# Patient Record
Sex: Male | Born: 1975 | Hispanic: No | Marital: Married | Smoking: Former smoker
Health system: Southern US, Community
[De-identification: ages and names within clinical notes are randomized; demographics above are authoritative.]

## PROBLEM LIST (undated history)

## (undated) DIAGNOSIS — F329 Major depressive disorder, single episode, unspecified: Secondary | ICD-10-CM

## (undated) DIAGNOSIS — F419 Anxiety disorder, unspecified: Secondary | ICD-10-CM

## (undated) DIAGNOSIS — F32A Depression, unspecified: Secondary | ICD-10-CM

## (undated) HISTORY — DX: Major depressive disorder, single episode, unspecified: F32.9

## (undated) HISTORY — DX: Anxiety disorder, unspecified: F41.9

## (undated) HISTORY — DX: Depression, unspecified: F32.A

---

## 2010-03-28 ENCOUNTER — Ambulatory Visit: Payer: Self-pay | Admitting: Family Medicine

## 2010-05-23 ENCOUNTER — Ambulatory Visit: Payer: Self-pay | Admitting: Family Medicine

## 2010-05-23 DIAGNOSIS — F411 Generalized anxiety disorder: Secondary | ICD-10-CM | POA: Insufficient documentation

## 2010-05-23 DIAGNOSIS — F329 Major depressive disorder, single episode, unspecified: Secondary | ICD-10-CM

## 2010-10-31 ENCOUNTER — Telehealth (INDEPENDENT_AMBULATORY_CARE_PROVIDER_SITE_OTHER): Payer: Self-pay | Admitting: *Deleted

## 2010-11-03 ENCOUNTER — Ambulatory Visit: Payer: Self-pay | Admitting: Family Medicine

## 2010-11-05 DIAGNOSIS — E669 Obesity, unspecified: Secondary | ICD-10-CM

## 2011-01-16 NOTE — Assessment & Plan Note (Signed)
Summary: sinus- room 1   Vital Signs:  Patient profile:   35 year old male Height:      73 inches Weight:      232.25 pounds BMI:     30.75 O2 Sat:      98 % on Room air Pulse rate:   74 / minute Resp:     16 per minute BP sitting:   120 / 80  (left arm)  Vitals Entered By: Adella Hare LPN (May 23, 6386 8:21 AM) CC: sinus drainage, hoarse Is Patient Diabetic? No Pain Assessment Patient in pain? no      Comments did not bring meds to office   CC:  sinus drainage and hoarse.  History of Present Illness: Pt is here today with c/o hoarse voice and post nasal drainage.  He denies nasal congestion or sinus pressure.  states he has phlegm in his throat which he tries to cough up.  His throat was sore over last wkend but isnt sore now.  No chest congestion or cough.  No fever or chills.  Pt is also taking Zoloft for  anxiety.  He has noticed some improvement but not as much as he had hoped.  He has less of the mind racing when he awakens, and is not as easily irritated by the children.  Sleep is good, but this has not been a problem.  No HI or SI.    Current Medications (verified): 1)  Sertraline Hcl 50 Mg Tabs (Sertraline Hcl) .... Take 1/2 Tab Daily X 6 Days, Then Take 1 Daily  Allergies (verified): No Known Drug Allergies  Past History:  Past medical history reviewed for relevance to current acute and chronic problems.  Past Medical History: Anxiety Depression  Review of Systems General:  Denies chills and fever. ENT:  Complains of postnasal drainage; denies earache, nasal congestion, sinus pressure, and sore throat. CV:  Denies chest pain or discomfort and palpitations. Resp:  Denies cough and shortness of breath. Psych:  Complains of anxiety and depression; denies easily angered, easily tearful, irritability, panic attacks, suicidal thoughts/plans, and thoughts /plans of harming others.  Physical Exam  General:  Well-developed,well-nourished,in no acute  distress; alert,appropriate and cooperative throughout examination Head:  Normocephalic and atraumatic without obvious abnormalities. No apparent alopecia or balding. Ears:  External ear exam shows no significant lesions or deformities.  Otoscopic examination reveals clear canals, tympanic membranes are intact bilaterally without bulging, retraction, inflammation or discharge. Hearing is grossly normal bilaterally. Nose:  no external deformity, no nasal discharge, no mucosal edema, and mucosal erythema.   Mouth:  Oral mucosa and oropharynx without lesions or exudates.  Teeth in good repair. Neck:  No deformities, masses, or tenderness noted. Lungs:  Normal respiratory effort, chest expands symmetrically. Lungs are clear to auscultation, no crackles or wheezes. Heart:  Normal rate and regular rhythm. S1 and S2 normal without gallop, murmur, click, rub or other extra sounds. Cervical Nodes:  No lymphadenopathy noted Psych:  Cognition and judgment appear intact. Alert and cooperative with normal attention span and concentration. No apparent delusions, illusions, hallucinations   Impression & Recommendations:  Problem # 1:  URI (ICD-465.9) Assessment New Discussed with pt that this is probably viral.  Abx rx  sent to pharmacy, but asked pt to wailt a couple days to fill since his syptoms are improving.  Problem # 2:  DEPRESSION/ANXIETY (ICD-300.4) Assessment: Improved Increase Zoloft dose.  Problem # 3:  ELEVATED BLOOD PRESSURE WITHOUT DIAGNOSIS OF HYPERTENSION (ICD-796.2) Assessment:  Improved  BP today: 120/80 Prior BP: 120/90 (03/28/2010)  Instructed in low sodium diet (DASH Handout) and behavior modification.    Complete Medication List: 1)  Amoxicillin 875 Mg Tabs (Amoxicillin) .... Take 1 bid 2)  Sertraline Hcl 100 Mg Tabs (Sertraline hcl) .... Take 1 daily  Patient Instructions: 1)  Please schedule a follow-up appointment in 1 month. 2)  I have increased your zoloft dose to 100  mg a day.  You may take 2 a day of the 50 mg tablets you have at home to use them up. 3)  I recommend you use Mucinex for your congestion.  Increase fluids and rest your voice. 4)  I have sent a prescription for an antibiotic to the pharmacy.  I would recommend you wait a couple of days before starting this to see if your syptoms improve. Prescriptions: SERTRALINE HCL 100 MG TABS (SERTRALINE HCL) take 1 daily  #30 x 3   Entered and Authorized by:   Esperanza Sheets PA   Signed by:   Esperanza Sheets PA on 05/23/2010   Method used:   Electronically to        [facility] [phone]* (retail)       [address] [place], [place]  [address]       Ph: [phone]       Fax: [phone]   RxID:   [id number] AMOXICILLIN 875 MG TABS (AMOXICILLIN) take 1 bid  #20 x 0   Entered and Authorized by:   Esperanza Sheets PA   Signed by:   Esperanza Sheets PA on 05/23/2010   Method used:   Electronically to        [facility]. [address] [phone]* (retail)       [address], [place]  [address]       Ph: [phone]       Fax: [phone]   RxID:   [id number]

## 2011-01-16 NOTE — Assessment & Plan Note (Signed)
Summary: new patient- room 1   Vital Signs:  Patient profile:   35 year old male Height:      73 inches Weight:      229.50 pounds BMI:     30.39 O2 Sat:      97 % on Room air Pulse rate:   85 / minute Resp:     16 per minute BP sitting:   120 / 90  (left arm)  Vitals Entered By: Adella Hare LPN (March 28, 2010 8:55 AM)  Nutrition Counseling: Patient's BMI is greater than 25 and therefore counseled on weight management options.  Serial Vital Signs/Assessments:  Time      Position  BP       Pulse  Resp  Temp     By                     124/90                         Esperanza Sheets PA  CC: new patient Is Patient Diabetic? No Pain Assessment Patient in pain? no        CC:  new patient.  History of Present Illness: New pt here to establish care with new PCP. Has been yrs since he has had a Dr/PCP.  C/o feelings of depression & anxiety x 1 -1 1/2 yrs.  Doesn't enjoy doing things like he use to, gets easily irritated or angered, feels sad.  States he has a very good life & no reason to feel like this.  He is afraid he is going to push his family away.  Has anxiety too, with heart racing & feeling nervous/worrying.  He sleeps 5-6 hrs/ night.  Feels rested when awakens but feels better when he can sleep about 8 hrs.  His mind starts racing a few minutes after awakens. No SI or HI.  No situational factors that seemed to precipitate this.  + FH of depression.  Pt states BP at home when checks it runs 140's over 90-94. No headaches, or chest pain.  He has been working on [employer], read about what he should & shouldn't be eating online, has been exercising & working on wt loss.    Current Medications (verified): 1)  None  Allergies (verified): No Known Drug Allergies  Past History:  Past medical, surgical, family and social histories (including risk factors) reviewed for relevance to current acute and chronic problems.  Family History: Reviewed history and no changes  required. Mother living- HTN, Gout Father living- DM, Depression, HTN One sister deceased- mva  Social History: Reviewed history and no changes required. Unemployed Married one week One child Never Smoked Alcohol use-yes socially Drug use-no Regular exercise-yes Smoking Status:  never Drug Use:  no Does Patient Exercise:  yes  Review of Systems CV:  Complains of palpitations; denies chest pain or discomfort; PALPITATIONS WITH ANXIETY. Resp:  Denies cough and shortness of breath. GI:  Denies abdominal pain, change in bowel habits, and indigestion. GU:  Denies decreased libido and erectile dysfunction.  Physical Exam  General:  Well-developed,well-nourished,in no acute distress; alert,appropriate and cooperative throughout examination Head:  Normocephalic and atraumatic without obvious abnormalities. No apparent alopecia or balding. Ears:  External ear exam shows no significant lesions or deformities.  Otoscopic examination reveals clear canals, tympanic membranes are intact bilaterally without bulging, retraction, inflammation or discharge. Hearing is grossly normal bilaterally. Nose:  External nasal examination shows no  deformity or inflammation. Nasal mucosa are pink and moist without lesions or exudates. Mouth:  Oral mucosa and oropharynx without lesions or exudates.  Teeth in good repair. Neck:  No deformities, masses, or tenderness noted.no thyromegaly.   Lungs:  Normal respiratory effort, chest expands symmetrically. Lungs are clear to auscultation, no crackles or wheezes. Heart:  Normal rate and regular rhythm. S1 and S2 normal without gallop, murmur, click, rub or other extra sounds. Skin:  Intact without suspicious lesions or rashes Cervical Nodes:  No lymphadenopathy noted Psych:  Cognition and judgment appear intact. Alert and cooperative with normal attention span and concentration. No apparent delusions, illusions, hallucinationsgood eye contact, not anxious appearing,  not depressed appearing, and not agitated.     Impression & Recommendations:  Problem # 1:  DEPRESSION/ANXIETY (ICD-300.4) Assessment New  Orders: T-TSH ([id number])  Problem # 2:  ELEVATED BLOOD PRESSURE WITHOUT DIAGNOSIS OF HYPERTENSION (ICD-796.2) Assessment: Improved Discussed with pt that his diastolic # is mildly elevated.  Will need to monitor this. Recommend he bring his home BP monitor to his next visit to check accuracy. Continue to work on diet, exericise & wt loss.  BP today: 120/90  Instructed in low sodium diet (DASH diett) and behavior modification.    Complete Medication List: 1)  Sertraline Hcl 50 Mg Tabs (Sertraline hcl) .... Take 1/2 tab daily x 6 days, then take 1 daily  Other Orders: T-Comprehensive Metabolic Panel [phone]) T-Lipid Profile ([id number]) T-CBC No Diff ([id number]) T-Vitamin D (25-Hydroxy) ([id number])  Patient Instructions: 1)  Please schedule a follow-up appointment in 1 month. 2)  Continue exercising & working on weight loss. 3)  I have ordered blood work for you to have done fasting today. 4)  I have prescribed Zoloft to take for depression & anxiety.  Take as directed on the bottle. Prescriptions: SERTRALINE HCL 50 MG TABS (SERTRALINE HCL) take 1/2 tab daily x 6 days, then take 1 daily  #30 x 1   Entered and Authorized by:   Esperanza Sheets PA   Signed by:   Esperanza Sheets PA on 03/28/2010   Method used:   Electronically to        [facility]. [address] [phone]* (retail)       [address], [place]  [address]       Ph: [phone]       Fax: [phone]   RxID:   [id number]

## 2011-01-16 NOTE — Progress Notes (Signed)
Summary: ZOLOFT  Phone Note Call from Patient   Summary of Call: ZOLOFT 100 MG, CALLED INTO [facility] IN [place] [facility]. Initial call taken by: Curtis Sites,  October 31, 2010 8:19 AM  Follow-up for Phone Call        patient needs appt Follow-up by: Adella Hare LPN,  November 01, 2010 12:11 PM  Additional Follow-up for Phone Call Additional follow up Details #1::        LEFT MESSAGE Additional Follow-up by: Lind Guest,  November 01, 2010 1:34 PM    Additional Follow-up for Phone Call Additional follow up Details #2::    APPT 11.17.11  Follow-up by: Lind Guest,  November 01, 2010 3:12 PM

## 2011-01-16 NOTE — Assessment & Plan Note (Signed)
Summary: MEDS   Vital Signs:  Patient profile:   35 year old male Height:      73 inches Weight:      233.50 pounds BMI:     30.92 O2 Sat:      96 % on Room air Pulse rate:   80 / minute Pulse rhythm:   regular Resp:     16 per minute BP sitting:   120 / 80  (left arm)  Vitals Entered By: Adella Hare LPN (November 03, 2010 10:30 AM)  Nutrition Counseling: Patient's BMI is greater than 25 and therefore counseled on weight management options.  O2 Flow:  Room air CC: follow-up visit/ refill Is Patient Diabetic? No Pain Assessment Patient in pain? no        Primary Care [facility]l:  Syliva Overman MD  CC:  follow-up visit/ refill.  History of Present Illness: Reports  that he has ben doing much better since starting setraline.He is more involved with the family, and less irritable , withdrawn and stressed. he does admit to overindulgence in alcohol, and realizes the need to cut back on this. he is also not involved in any regular exercise and is obese, and has decided that this needs to be changed Denies recent fever or chills. Denies sinus pressure, nasal congestion , ear pain or sore throat. Denies chest congestion, or cough productive of sputum. Denies chest pain, palpitations, PND, orthopnea or leg swelling. Denies abdominal pain, nausea, vomitting, diarrhea or constipation. Denies change in bowel movements or bloody stool. Denies dysuria , frequency, incontinence or hesitancy. Denies  joint pain, swelling, or reduced mobility. Denies headaches, vertigo, seizures. Denies depression, anxiety or insomnia. Denies  rash, lesions, or itch.     Current Medications (verified): 1)  Sertraline Hcl 100 Mg Tabs (Sertraline Hcl) .... Take 1 Daily  Allergies (verified): No Known Drug Allergies  Family History: Mother living- HTN, Gout Father living- DM, Depression, HTN 3 sisters all iving 1 brother died in an mVA age 15  Social History: employed in  2009,transportation Married in 2011 One daughter  Never Smoked Alcohol use-yes,  Drug use-no Regular exercise-yes  Review of Systems      See HPI Eyes:  Denies blurring and discharge. Endo:  Denies cold intolerance, excessive hunger, excessive thirst, and excessive urination. Heme:  Denies abnormal bruising and bleeding. Allergy:  Denies hives or rash and itching eyes.  Physical Exam  General:  Well-developed,well-nourished,in no acute distress; alert,appropriate and cooperative throughout examination HEENT: No facial asymmetry,  EOMI, No sinus tenderness, TM's Clear, oropharynx  pink and moist.   Chest: Clear to auscultation bilaterally.  CVS: S1, S2, No murmurs, No S3.   Abd: Soft, Nontender.  MS: Adequate ROM spine, hips, shoulders and knees.  Ext: No edema.   CNS: CN 2-12 intact, power tone and sensation normal throughout.   Skin: Intact, no visible lesions or rashes.  Psych: Good eye contact, normal affect.  Memory intact, not anxious or depressed appearing.    Impression & Recommendations:  Problem # 1:  DEPRESSION (ICD-311) Assessment Improved  His updated medication list for this problem includes:    Sertraline Hcl 100 Mg Tabs (Sertraline hcl) .Marland Kitchen... Take 1 daily  Problem # 2:  ANXIETY (ICD-300.00) Assessment: Improved  His updated medication list for this problem includes:    Sertraline Hcl 100 Mg Tabs (Sertraline hcl) .Marland Kitchen... Take 1 daily  Problem # 3:  OBESITY (ICD-278.00) Assessment: Deteriorated  Ht: 73 (11/03/2010)   Wt: 233.50 (11/03/2010)  BMI: 30.92 (11/03/2010) therapeutic lifestyle change discussed and encouraged  Complete Medication List: 1)  Sertraline Hcl 100 Mg Tabs (Sertraline hcl) .... Take 1 daily  Other Orders: T-Basic Metabolic Panel [phone]) T-Lipid Profile [phone]) T-CBC w/Diff [phone]) T-TSH [phone]) Influenza Vaccine NON MCR (28413)  Patient Instructions: 1)  Please schedule a follow-up appointment in 4   months. 2)  It is important that you exercise regularly at least 40 minutes 5 times a week. If you develop chest pain, have severe difficulty breathing, or feel very tired , stop exercising immediately and seek medical attention. 3)  You need to lose weight. Consider a lower calorie diet and regular exercise. goal is 5 to 10 pounds 4)  I am happy that your meds are working well for you. 5)  BMP prior to visit, ICD-9: 6)  Lipid Panel prior to visit, ICD-9:  fasting  7)  TSH prior to visit, ICD-9: 8)  CBC w/ Diff prior to visit, ICD-9: 9)  pLS reduce alcohol use Prescriptions: SERTRALINE HCL 100 MG TABS (SERTRALINE HCL) take 1 daily  #30 x 3   Entered by:   Adella Hare LPN   Authorized by:   Syliva Overman MD   Signed by:   Adella Hare LPN on 24/40/1027   Method used:   Electronically to        [facility] [address] [phone]* (retail)       [address] [place], [place]  [address]       Ph: [phone]       Fax: [phone]   RxID:   [id number]    Orders Added: 1)  Est. Patient Level IV [23557] 2)  T-Basic Metabolic Panel [phone] 3)  T-Lipid Profile [[id number]] 4)  T-CBC w/Diff [[id number]] 5)  T-TSH [[id number]] 6)  Influenza Vaccine NON MCR [00028]   Immunizations Administered:  Influenza Vaccine # 1:    Vaccine Type: Fluvax Non-MCR    Site: left deltoid    Mfr: novartis    Dose: 0.5 ml    Route: IM    Given by: Adella Hare LPN    Exp. Date: 04/2011    Lot #: [id number]    VIS given: 07/11/10 version given November 03, 2010.   Immunizations Administered:  Influenza Vaccine # 1:    Vaccine Type: Fluvax Non-MCR    Site: left deltoid    Mfr: novartis    Dose: 0.5 ml    Route: IM    Given by: Adella Hare LPN    Exp. Date: 04/2011    Lot #: [id number]    VIS given: 07/11/10 version given November 03, 2010.

## 2011-03-05 ENCOUNTER — Encounter: Payer: Self-pay | Admitting: Family Medicine

## 2011-03-05 ENCOUNTER — Ambulatory Visit: Payer: Self-pay | Admitting: Family Medicine

## 2011-03-06 ENCOUNTER — Encounter: Payer: Self-pay | Admitting: Family Medicine

## 2011-04-22 ENCOUNTER — Emergency Department (HOSPITAL_COMMUNITY)
Admission: EM | Admit: 2011-04-22 | Discharge: 2011-04-22 | Disposition: A | Discharge status: Home / Self Care | Payer: Self-pay | Attending: Emergency Medicine | Admitting: Emergency Medicine

## 2011-04-22 DIAGNOSIS — R300 Dysuria: Secondary | ICD-10-CM | POA: Insufficient documentation

## 2011-04-22 DIAGNOSIS — N342 Other urethritis: Secondary | ICD-10-CM | POA: Insufficient documentation

## 2011-04-22 DIAGNOSIS — R319 Hematuria, unspecified: Secondary | ICD-10-CM | POA: Insufficient documentation

## 2011-04-22 DIAGNOSIS — N509 Disorder of male genital organs, unspecified: Secondary | ICD-10-CM | POA: Insufficient documentation

## 2011-04-22 DIAGNOSIS — R110 Nausea: Secondary | ICD-10-CM | POA: Insufficient documentation

## 2011-04-22 LAB — URINALYSIS, ROUTINE W REFLEX MICROSCOPIC
Glucose, UA: NEGATIVE mg/dL
Protein, ur: NEGATIVE mg/dL

## 2011-04-22 LAB — URINE MICROSCOPIC-ADD ON

## 2011-04-24 LAB — URINE CULTURE
Colony Count: NO GROWTH
Culture  Setup Time: 201205062230
Culture: NO GROWTH

## 2011-04-24 LAB — GC/CHLAMYDIA PROBE AMP, URINE: GC Probe Amp, Urine: NEGATIVE

## 2013-07-24 ENCOUNTER — Emergency Department (HOSPITAL_COMMUNITY): Payer: BC Managed Care – PPO

## 2013-07-24 ENCOUNTER — Encounter (HOSPITAL_COMMUNITY): Payer: Self-pay | Admitting: *Deleted

## 2013-07-24 ENCOUNTER — Emergency Department (HOSPITAL_COMMUNITY)
Admission: EM | Admit: 2013-07-24 | Discharge: 2013-07-24 | Disposition: A | Discharge status: Home / Self Care | Payer: BC Managed Care – PPO | Attending: Emergency Medicine | Admitting: Emergency Medicine

## 2013-07-24 DIAGNOSIS — S81009A Unspecified open wound, unspecified knee, initial encounter: Secondary | ICD-10-CM | POA: Insufficient documentation

## 2013-07-24 DIAGNOSIS — Z8659 Personal history of other mental and behavioral disorders: Secondary | ICD-10-CM | POA: Insufficient documentation

## 2013-07-24 DIAGNOSIS — S43086A Other dislocation of unspecified shoulder joint, initial encounter: Secondary | ICD-10-CM | POA: Insufficient documentation

## 2013-07-24 DIAGNOSIS — Y92410 Unspecified street and highway as the place of occurrence of the external cause: Secondary | ICD-10-CM | POA: Insufficient documentation

## 2013-07-24 DIAGNOSIS — Y9389 Activity, other specified: Secondary | ICD-10-CM | POA: Insufficient documentation

## 2013-07-24 DIAGNOSIS — S43004A Unspecified dislocation of right shoulder joint, initial encounter: Secondary | ICD-10-CM

## 2013-07-24 DIAGNOSIS — T148XXA Other injury of unspecified body region, initial encounter: Secondary | ICD-10-CM

## 2013-07-24 DIAGNOSIS — S91009A Unspecified open wound, unspecified ankle, initial encounter: Secondary | ICD-10-CM | POA: Insufficient documentation

## 2013-07-24 DIAGNOSIS — F17200 Nicotine dependence, unspecified, uncomplicated: Secondary | ICD-10-CM | POA: Insufficient documentation

## 2013-07-24 MED ORDER — HYDROCODONE-ACETAMINOPHEN 5-325 MG PO TABS: 1.0000 | ORAL_TABLET | ORAL | Status: DC | PRN

## 2013-07-24 MED ORDER — SODIUM CHLORIDE 0.9 % IV BOLUS (SEPSIS)
1000.0000 mL | Freq: Once | INTRAVENOUS | Status: AC
  Administered 2013-07-24: 1000 mL via INTRAVENOUS

## 2013-07-24 MED ORDER — IBUPROFEN 800 MG PO TABS: 800.0000 mg | ORAL_TABLET | Freq: Three times a day (TID) | ORAL | Status: DC | PRN

## 2013-07-24 MED ORDER — HYDROMORPHONE HCL PF 1 MG/ML IJ SOLN
1.0000 mg | Freq: Once | INTRAMUSCULAR | Status: AC
  Administered 2013-07-24: 1 mg via INTRAVENOUS

## 2013-07-24 MED ORDER — HYDROMORPHONE HCL PF 1 MG/ML IJ SOLN
INTRAMUSCULAR | Status: AC
  Filled 2013-07-24: qty 1

## 2013-07-24 MED ORDER — PROPOFOL 10 MG/ML IV BOLUS
2.0000 mg/kg | Freq: Once | INTRAVENOUS | Status: AC
  Administered 2013-07-24: 100 mg via INTRAVENOUS
  Filled 2013-07-24: qty 20

## 2013-07-24 MED ORDER — HYDROMORPHONE HCL PF 1 MG/ML IJ SOLN
1.0000 mg | Freq: Once | INTRAMUSCULAR | Status: AC
  Administered 2013-07-24: 1 mg via INTRAVENOUS
  Filled 2013-07-24 (×2): qty 1

## 2013-07-24 MED ORDER — HYDROMORPHONE HCL PF 1 MG/ML IJ SOLN
1.0000 mg | Freq: Once | INTRAMUSCULAR | Status: AC
  Administered 2013-07-24: 1 mg via INTRAMUSCULAR

## 2013-07-24 NOTE — ED Notes (Signed)
IV team paged for IV start. 

## 2013-07-24 NOTE — Progress Notes (Signed)
Orthopedic Tech Progress Note Patient Details:  Christian Phillips [birth year] [id number]  Ortho Devices Type of Ortho Device: Arm sling Ortho Device/Splint Location: right arm Ortho Device/Splint Interventions: Application   Nikki Dom 07/24/2013, 8:52 PM
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
## 2013-07-24 NOTE — ED Notes (Signed)
IV attempt x 2. Unsuccessful. Other RN to try
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
## 2013-07-24 NOTE — ED Provider Notes (Addendum)
Shoulder was successfully reduced by Dr.Schlossman, resident physician. I was present during the entire procedure.  Doug Sou, MD 07/24/13 2117 I obbserved Dr. Tomma Lightning and pt throughout entire procedfurwe including procedural sedation and shoulder reduction and recovery of pt  Doug Sou, MD 08/21/13 1450

## 2013-07-25 NOTE — ED Provider Notes (Signed)
I have personally seen and examined the patient.  I have discussed the plan of care with the resident.  I have reviewed the documentation on PMH/FH/Soc. History.  I have reviewed the documentation of the resident and agree.  Torian Quintero, MD 07/25/13 1457 

## 2019-01-30 ENCOUNTER — Other Ambulatory Visit: Payer: Self-pay

## 2019-01-30 ENCOUNTER — Encounter: Payer: Self-pay | Admitting: Family Medicine

## 2019-01-30 ENCOUNTER — Ambulatory Visit (INDEPENDENT_AMBULATORY_CARE_PROVIDER_SITE_OTHER): Payer: PRIVATE HEALTH INSURANCE

## 2019-01-30 ENCOUNTER — Ambulatory Visit (INDEPENDENT_AMBULATORY_CARE_PROVIDER_SITE_OTHER): Payer: PRIVATE HEALTH INSURANCE | Admitting: Family Medicine

## 2019-01-30 VITALS — BP 124/66 | HR 69 | Temp 98.7°F | Ht 73.0 in | Wt 220.4 lb

## 2019-01-30 DIAGNOSIS — M5442 Lumbago with sciatica, left side: Secondary | ICD-10-CM

## 2019-01-30 DIAGNOSIS — M7918 Myalgia, other site: Secondary | ICD-10-CM

## 2019-01-30 DIAGNOSIS — M545 Low back pain: Secondary | ICD-10-CM

## 2019-01-30 MED ORDER — CYCLOBENZAPRINE HCL 5 MG PO TABS: ORAL_TABLET | ORAL | 0 refills | Status: DC

## 2019-01-30 MED ORDER — MELOXICAM 7.5 MG PO TABS: 7.5000 mg | ORAL_TABLET | Freq: Every day | ORAL | 0 refills | Status: DC

## 2019-01-30 NOTE — Patient Instructions (Addendum)
Try the meloxicam once during the day as needed (avoid taking other NSAIDS like Alleve or Ibuprofen while taking this) and then flexeril if needed at bedtime for muscle spasm. This can be taken up to every 8 hours, but causes sedation, so should not drive or operate heavy machinery while taking this medicine. I will refer you to physical therapy. Recheck in 3-4 weeks.  Return to the clinic or go to the nearest emergency room if any of your symptoms worsen or new symptoms occur.   Sciatica  Sciatica is pain, numbness, weakness, or tingling along the path of the sciatic nerve. The sciatic nerve starts in the lower back and runs down the back of each leg. The nerve controls the muscles in the lower leg and in the back of the knee. It also provides feeling (sensation) to the back of the thigh, the lower leg, and the sole of the foot. Sciatica is a symptom of another medical condition that pinches or puts pressure on the sciatic nerve. Generally, sciatica only affects one side of the body. Sciatica usually goes away on its own or with treatment. In some cases, sciatica may keep coming back (recur). What are the causes? This condition is caused by pressure on the sciatic nerve, or pinching of the sciatic nerve. This may be the result of:  A disk in between the bones of the spine (vertebrae) bulging out too far (herniated disk).  Age-related changes in the spinal disks (degenerative disk disease).  A pain disorder that affects a muscle in the buttock (piriformis syndrome).  Extra bone growth (bone spur) near the sciatic nerve.  An injury or break (fracture) of the pelvis.  Pregnancy.  Tumor (rare). What increases the risk? The following factors may make you more likely to develop this condition:  Playing sports that place pressure or stress on the spine, such as football or weight lifting.  Having poor strength and flexibility.  A history of back injury.  A history of back  surgery.  Sitting for long periods of time.  Doing activities that involve repetitive bending or lifting.  Obesity. What are the signs or symptoms? Symptoms can vary from mild to very severe, and they may include:  Any of these problems in the lower back, leg, hip, or buttock: ? Mild tingling or dull aches. ? Burning sensations. ? Sharp pains.  Numbness in the back of the calf or the sole of the foot.  Leg weakness.  Severe back pain that makes movement difficult. These symptoms may get worse when you cough, sneeze, or laugh, or when you sit or stand for long periods of time. Being overweight may also make symptoms worse. In some cases, symptoms may recur over time. How is this diagnosed? This condition may be diagnosed based on:  Your symptoms.  A physical exam. Your health care provider may ask you to do certain movements to check whether those movements trigger your symptoms.  You may have tests, including: ? Blood tests. ? X-rays. ? MRI. ? CT scan. How is this treated? In many cases, this condition improves on its own, without any treatment. However, treatment may include:  Reducing or modifying physical activity during periods of pain.  Exercising and stretching to strengthen your abdomen and improve the flexibility of your spine.  Icing and applying heat to the affected area.  Medicines that help: ? To relieve pain and swelling. ? To relax your muscles.  Injections of medicines that help to relieve pain, irritation, and  inflammation around the sciatic nerve (steroids).  Surgery. Follow these instructions at home: Medicines  Take over-the-counter and prescription medicines only as told by your health care provider.  Do not drive or operate heavy machinery while taking prescription pain medicine. Managing pain  If directed, apply ice to the affected area. ? Put ice in a plastic bag. ? Place a towel between your skin and the bag. ? Leave the ice on for  20 minutes, 2-3 times a day.  After icing, apply heat to the affected area before you exercise or as often as told by your health care provider. Use the heat source that your health care provider recommends, such as a moist heat pack or a heating pad. ? Place a towel between your skin and the heat source. ? Leave the heat on for 20-30 minutes. ? Remove the heat if your skin turns bright red. This is especially important if you are unable to feel pain, heat, or cold. You may have a greater risk of getting burned. Activity  Return to your normal activities as told by your health care provider. Ask your health care provider what activities are safe for you. ? Avoid activities that make your symptoms worse.  Take brief periods of rest throughout the day. Resting in a lying or standing position is usually better than sitting to rest. ? When you rest for longer periods, mix in some mild activity or stretching between periods of rest. This will help to prevent stiffness and pain. ? Avoid sitting for long periods of time without moving. Get up and move around at least one time each hour.  Exercise and stretch regularly, as told by your health care provider.  Do not lift anything that is heavier than 10 lb (4.5 kg) while you have symptoms of sciatica. When you do not have symptoms, you should still avoid heavy lifting, especially repetitive heavy lifting.  When you lift objects, always use proper lifting technique, which includes: ? Bending your knees. ? Keeping the load close to your body. ? Avoiding twisting. General instructions  Use good posture. ? Avoid leaning forward while sitting. ? Avoid hunching over while standing.  Maintain a healthy weight. Excess weight puts extra stress on your back and makes it difficult to maintain good posture.  Wear supportive, comfortable shoes. Avoid wearing high heels.  Avoid sleeping on a mattress that is too soft or too hard. A mattress that is firm  enough to support your back when you sleep may help to reduce your pain.  Keep all follow-up visits as told by your health care provider. This is important. Contact a health care provider if:  You have pain that wakes you up when you are sleeping.  You have pain that gets worse when you lie down.  Your pain is worse than you have experienced in the past.  Your pain lasts longer than 4 weeks.  You experience unexplained weight loss. Get help right away if:  You lose control of your bowel or bladder (incontinence).  You have: ? Weakness in your lower back, pelvis, buttocks, or legs that gets worse. ? Redness or swelling of your back. ? A burning sensation when you urinate. This information is not intended to replace advice given to you by your health care provider. Make sure you discuss any questions you have with your health care provider. Document Released: 11/27/2001 Document Revised: 05/08/2016 Document Reviewed: 08/12/2015 Elsevier Interactive Patient Education  [employer].   If you have  lab work done today you will be contacted with your lab results within the next 2 weeks.  If you have not heard from us then please contact us. The fastest way to get your results is to register for My Chart.   IF you received an x-ray today, you will receive an invoice from [facility]. Please contact [facility] at [phone] with questions or concerns regarding your invoice.   IF you received labwork today, you will receive an invoice from [facility]. Please contact [facility] at [phone] with questions or concerns regarding your invoice.   Our billing staff will not be able to assist you with questions regarding bills from these companies.  You will be contacted with the lab results as soon as they are available. The fastest way to get your results is to activate your My Chart account. Instructions are located on the last page of this paperwork. If you have not  heard from us regarding the results in 2 weeks, please contact this office.

## 2019-01-30 NOTE — Progress Notes (Signed)
Subjective:    Patient ID: Christian Phillips, male    DOB: 11/01/1976, 43 y.o.   MRN: [id number]  HPI Christian Phillips is a 43 y.o. male Presents today for: Chief Complaint  Patient presents with  . Hip and leg pain    mostly on the left side  . Back Pain   Larey Seat few months ago - November or December of last year. Slipped on wet floor. Some soreness in left buttock at that time. No treatment. Had cramp in leg once, then overall persistent pain in left lower leg. Pain with walking at times in left buttock with shooting sensation. Some low back pain both sides, more on left.  Has noticed pain in left buttock down left leg to foot.  Some soreness in R side after hard day at work, but primarily in left leg.  Works in Product/process development scientist, heavy lifting at times - 10-15 times of lifting heavy buckets for [employer].  No back surgery/injection.  No bowel or bladder incontinence, no saddle anesthesia, no lower extremity weakness.    Tx: ibuprofen - 800mg  when more sharp pain - usually before work- up ton QD, but not daily. Mod relief.  Heating pad, bath.       Patient Active Problem List   Diagnosis Date Noted  . OBESITY 11/05/2010  . ANXIETY 05/23/2010  . DEPRESSION 05/23/2010   Past Medical History:  Diagnosis Date  . Anxiety   . Depression    No past surgical history on file. No Known Allergies Prior to Admission medications   Medication Sig Start Date End Date Taking? Authorizing Provider  ibuprofen (ADVIL,MOTRIN) 200 MG tablet Take 200 mg by mouth every 6 (six) hours as needed.   Yes [provider]   Social History   Socioeconomic History  . Marital status: Married    Spouse name: Not on file  . Number of children: 1  . Years of education: Not on file  . Highest education level: Not on file  Occupational History  . Occupation: transportation  Social Needs  . Financial resource strain: Not on file  . Food insecurity:    Worry: Not on file    Inability: Not on  file  . Transportation needs:    Medical: Not on file    Non-medical: Not on file  Tobacco Use  . Smoking status: Former Smoker    Start date: 07/30/2018  . Smokeless tobacco: Never Used  Substance and Sexual Activity  . Alcohol use: Yes  . Drug use: Not on file  . Sexual activity: Yes  Lifestyle  . Physical activity:    Days per week: Not on file    Minutes per session: Not on file  . Stress: Not on file  Relationships  . Social connections:    Talks on phone: Not on file    Gets together: Not on file    Attends religious service: Not on file    Active member of club or organization: Not on file    Attends meetings of clubs or organizations: Not on file    Relationship status: Not on file  . Intimate partner violence:    Fear of current or ex partner: Not on file    Emotionally abused: Not on file    Physically abused: Not on file    Forced sexual activity: Not on file  Other Topics Concern  . Not on file  Social History Narrative  . Not on file    Review of  Systems  Constitutional: Negative for chills, diaphoresis, fatigue, fever and unexpected weight change.       Objective:   Physical Exam Constitutional:      Appearance: He is well-developed.  HENT:     Head: Normocephalic and atraumatic.  Neck:     Musculoskeletal: Normal range of motion.  Pulmonary:     Effort: Pulmonary effort is normal.  Abdominal:     Palpations: Abdomen is soft.     Tenderness: There is no abdominal tenderness.  Musculoskeletal:     Left hip: He exhibits tenderness (lateral hip to buttocks. min troch bursa ttp. ).     Lumbar back: He exhibits tenderness (left sciatic notch. ) and spasm. He exhibits normal range of motion (inatct rom, pain in left leg/buttocks with R lat flexion. ) and no bony tenderness.  Skin:    General: Skin is warm and dry.  Neurological:     Mental Status: He is alert.     Sensory: No sensory deficit.     Deep Tendon Reflexes:     Reflex Scores:       Patellar reflexes are 2+ on the right side and 2+ on the left side.      Achilles reflexes are 2+ on the right side and 2+ on the left side.    Comments: Able to heel and toe walk without difficulty.  Psychiatric:        Behavior: Behavior normal.   positive SLR on left, not right.  Vitals:   01/30/19 1420  BP: 124/66  Pulse: 69  Temp: 98.7 F (37.1 C)  TempSrc: Oral  SpO2: 97%  Weight: 220 lb 6.4 oz (100 kg)  Height: 6\' 1"  (1.854 m)   Dg Lumbar Spine Complete  Result Date: 01/30/2019 CLINICAL DATA:  Left low back and buttock/ischial pain after a fall a few months ago. EXAM: LUMBAR SPINE - COMPLETE 4+ VIEW COMPARISON:  None. FINDINGS: There 5 non rib-bearing lumbar type vertebrae. There is grade 1 anterolisthesis of L5 on S1 with moderate L5-S1 disc space narrowing. L5-S1 facet arthrosis is noted. No definite pars defects are identified. Vertebral body heights are preserved without evidence of fracture. IMPRESSION: 1. No evidence of acute osseous abnormality. 2. L5-S1 disc and facet degeneration with grade 1 anterolisthesis. Electronically Signed   By: Sebastian AcheAllen  Grady M.D.   On: 01/30/2019 15:30   Dg Pelvis 1-2 Views  Result Date: 01/30/2019 CLINICAL DATA:  Left low back and buttock/ischial pain after a fall a few months ago. EXAM: PELVIS - 1-2 VIEW COMPARISON:  None. FINDINGS: There is no evidence of pelvic fracture or diastasis. No pelvic bone lesions are seen. IMPRESSION: Negative. Electronically Signed   By: Sebastian AcheAllen  Grady M.D.   On: 01/30/2019 15:26       Assessment & Plan:    Christian Phillips is a 43 y.o. male Left-sided low back pain with left-sided sciatica, unspecified chronicity - Plan: meloxicam (MOBIC) 7.5 MG tablet, cyclobenzaprine (FLEXERIL) 5 MG tablet, Ambulatory referral to Physical Therapy, DG Lumbar Spine Complete  Buttock pain - Plan: DG Pelvis 1-2 Views  Grade 1 spondylolisthesis noted, no other concerning bony findings.  Suspected contusion with possible lumbar  strain, now with primarily sciatic symptoms.  Strength intact, good range of motion.  No red flags on history/exam.  Will initially try meloxicam 7.5 mg daily, Flexeril if needed at night, refer to physical therapy, recheck 3 to 4 weeks.  Sooner if worse.  Consider back specialist eval if persistent discomfort given  x-ray reading above.  Meds ordered this encounter  Medications  . meloxicam (MOBIC) 7.5 MG tablet    Sig: Take 1 tablet (7.5 mg total) by mouth daily.    Dispense:  30 tablet    Refill:  0  . cyclobenzaprine (FLEXERIL) 5 MG tablet    Sig: 1 pill by mouth up to every 8 hours as needed. Start with one pill by mouth each bedtime as needed due to sedation    Dispense:  15 tablet    Refill:  0   Patient Instructions   Try the meloxicam once during the day as needed (avoid taking other NSAIDS like Alleve or Ibuprofen while taking this) and then flexeril if needed at bedtime for muscle spasm. This can be taken up to every 8 hours, but causes sedation, so should not drive or operate heavy machinery while taking this medicine. I will refer you to physical therapy. Recheck in 3-4 weeks.  Return to the clinic or go to the nearest emergency room if any of your symptoms worsen or new symptoms occur.   Sciatica  Sciatica is pain, numbness, weakness, or tingling along the path of the sciatic nerve. The sciatic nerve starts in the lower back and runs down the back of each leg. The nerve controls the muscles in the lower leg and in the back of the knee. It also provides feeling (sensation) to the back of the thigh, the lower leg, and the sole of the foot. Sciatica is a symptom of another medical condition that pinches or puts pressure on the sciatic nerve. Generally, sciatica only affects one side of the body. Sciatica usually goes away on its own or with treatment. In some cases, sciatica may keep coming back (recur). What are the causes? This condition is caused by pressure on the sciatic  nerve, or pinching of the sciatic nerve. This may be the result of:  A disk in between the bones of the spine (vertebrae) bulging out too far (herniated disk).  Age-related changes in the spinal disks (degenerative disk disease).  A pain disorder that affects a muscle in the buttock (piriformis syndrome).  Extra bone growth (bone spur) near the sciatic nerve.  An injury or break (fracture) of the pelvis.  Pregnancy.  Tumor (rare). What increases the risk? The following factors may make you more likely to develop this condition:  Playing sports that place pressure or stress on the spine, such as football or weight lifting.  Having poor strength and flexibility.  A history of back injury.  A history of back surgery.  Sitting for long periods of time.  Doing activities that involve repetitive bending or lifting.  Obesity. What are the signs or symptoms? Symptoms can vary from mild to very severe, and they may include:  Any of these problems in the lower back, leg, hip, or buttock: ? Mild tingling or dull aches. ? Burning sensations. ? Sharp pains.  Numbness in the back of the calf or the sole of the foot.  Leg weakness.  Severe back pain that makes movement difficult. These symptoms may get worse when you cough, sneeze, or laugh, or when you sit or stand for long periods of time. Being overweight may also make symptoms worse. In some cases, symptoms may recur over time. How is this diagnosed? This condition may be diagnosed based on:  Your symptoms.  A physical exam. Your health care provider may ask you to do certain movements to check whether those movements trigger your  symptoms.  You may have tests, including: ? Blood tests. ? X-rays. ? MRI. ? CT scan. How is this treated? In many cases, this condition improves on its own, without any treatment. However, treatment may include:  Reducing or modifying physical activity during periods of pain.  Exercising  and stretching to strengthen your abdomen and improve the flexibility of your spine.  Icing and applying heat to the affected area.  Medicines that help: ? To relieve pain and swelling. ? To relax your muscles.  Injections of medicines that help to relieve pain, irritation, and inflammation around the sciatic nerve (steroids).  Surgery. Follow these instructions at home: Medicines  Take over-the-counter and prescription medicines only as told by your health care provider.  Do not drive or operate heavy machinery while taking prescription pain medicine. Managing pain  If directed, apply ice to the affected area. ? Put ice in a plastic bag. ? Place a towel between your skin and the bag. ? Leave the ice on for 20 minutes, 2-3 times a day.  After icing, apply heat to the affected area before you exercise or as often as told by your health care provider. Use the heat source that your health care provider recommends, such as a moist heat pack or a heating pad. ? Place a towel between your skin and the heat source. ? Leave the heat on for 20-30 minutes. ? Remove the heat if your skin turns bright red. This is especially important if you are unable to feel pain, heat, or cold. You may have a greater risk of getting burned. Activity  Return to your normal activities as told by your health care provider. Ask your health care provider what activities are safe for you. ? Avoid activities that make your symptoms worse.  Take brief periods of rest throughout the day. Resting in a lying or standing position is usually better than sitting to rest. ? When you rest for longer periods, mix in some mild activity or stretching between periods of rest. This will help to prevent stiffness and pain. ? Avoid sitting for long periods of time without moving. Get up and move around at least one time each hour.  Exercise and stretch regularly, as told by your health care provider.  Do not lift anything  that is heavier than 10 lb (4.5 kg) while you have symptoms of sciatica. When you do not have symptoms, you should still avoid heavy lifting, especially repetitive heavy lifting.  When you lift objects, always use proper lifting technique, which includes: ? Bending your knees. ? Keeping the load close to your body. ? Avoiding twisting. General instructions  Use good posture. ? Avoid leaning forward while sitting. ? Avoid hunching over while standing.  Maintain a healthy weight. Excess weight puts extra stress on your back and makes it difficult to maintain good posture.  Wear supportive, comfortable shoes. Avoid wearing high heels.  Avoid sleeping on a mattress that is too soft or too hard. A mattress that is firm enough to support your back when you sleep may help to reduce your pain.  Keep all follow-up visits as told by your health care provider. This is important. Contact a health care provider if:  You have pain that wakes you up when you are sleeping.  You have pain that gets worse when you lie down.  Your pain is worse than you have experienced in the past.  Your pain lasts longer than 4 weeks.  You experience unexplained weight  loss. Get help right away if:  You lose control of your bowel or bladder (incontinence).  You have: ? Weakness in your lower back, pelvis, buttocks, or legs that gets worse. ? Redness or swelling of your back. ? A burning sensation when you urinate. This information is not intended to replace advice given to you by your health care provider. Make sure you discuss any questions you have with your health care provider. Document Released: 11/27/2001 Document Revised: 05/08/2016 Document Reviewed: 08/12/2015 Elsevier Interactive Patient Education  [employer].   If you have lab work done today you will be contacted with your lab results within the next 2 weeks.  If you have not heard from [place] then please contact us. The fastest way to get  your results is to register for My Chart.   IF you received an x-ray today, you will receive an invoice from [facility]. Please contact [facility] at [phone] with questions or concerns regarding your invoice.   IF you received labwork today, you will receive an invoice from [place]. Please contact [facility] at [phone] with questions or concerns regarding your invoice.   Our billing staff will not be able to assist you with questions regarding bills from these companies.  You will be contacted with the lab results as soon as they are available. The fastest way to get your results is to activate your My Chart account. Instructions are located on the last page of this paperwork. If you have not heard from [place] regarding the results in 2 weeks, please contact this office.       Signed,   Meredith Staggers, MD Primary Care at [facility].  01/31/19 11:49 AM

## 2019-01-31 ENCOUNTER — Encounter: Payer: Self-pay | Admitting: Family Medicine

## 2019-02-20 ENCOUNTER — Encounter: Payer: Self-pay | Admitting: Radiology

## 2019-02-27 ENCOUNTER — Ambulatory Visit: Payer: No Typology Code available for payment source | Admitting: Family Medicine

## 2019-03-02 ENCOUNTER — Other Ambulatory Visit: Payer: Self-pay

## 2019-03-02 ENCOUNTER — Encounter (HOSPITAL_COMMUNITY): Payer: Self-pay | Admitting: Emergency Medicine

## 2019-03-02 ENCOUNTER — Ambulatory Visit (HOSPITAL_COMMUNITY)
Admission: EM | Admit: 2019-03-02 | Discharge: 2019-03-02 | Disposition: A | Discharge status: Home / Self Care | Payer: PRIVATE HEALTH INSURANCE | Attending: Family Medicine | Admitting: Family Medicine

## 2019-03-02 DIAGNOSIS — K529 Noninfective gastroenteritis and colitis, unspecified: Secondary | ICD-10-CM | POA: Diagnosis not present

## 2019-03-02 MED ORDER — ONDANSETRON 4 MG PO TBDP: 4.0000 mg | ORAL_TABLET | Freq: Three times a day (TID) | ORAL | 0 refills | Status: DC | PRN

## 2019-03-02 NOTE — Discharge Instructions (Addendum)

## 2019-03-02 NOTE — ED Provider Notes (Signed)
[facility]   [id number] 03/02/19 Arrival Time: 1033  ASSESSMENT & PLAN:  1. Gastroenteritis    Benign abdominal exam. No indication for abdominal imaging today. Discussed.  Meds ordered this encounter  Medications  . ondansetron (ZOFRAN-ODT) 4 MG disintegrating tablet    Sig: Take 1 tablet (4 mg total) by mouth every 8 (eight) hours as needed for nausea or vomiting.    Dispense:  15 tablet    Refill:  0   Will defer stool PCR at this time. Prefers to see if he improves over the next 24-48 hours.  Follow-up Information    [facility].   Specialty:  Urgent Care Why:  If not improving over the next 24-48 hours. Contact information: [address] [place] [address] [phone]         Discussed typical duration of symptoms for suspected viral GI illness. Will do his best to ensure adequate fluid intake in order to avoid dehydration. Will proceed to the Emergency Department for evaluation if unable to tolerate PO fluids regularly.  Otherwise he will f/u with his PCP or here if not showing improvement over the next 48-72 hours.  Reviewed expectations re: course of current medical issues. Questions answered. Outlined signs and symptoms indicating need for more acute intervention. Patient verbalized understanding. After Visit Summary given.   SUBJECTIVE: History from: patient.  Christian Phillips is a 43 y.o. male who presents with complaint of non-bilious, non-bloody intermittent nausea in the absence of vomiting with non-bloody diarrhea. Initial emesis at onset but this has stopped. Onset of first symptoms 3-4 days ago. Abdominal discomfort: mild and cramping. Symptoms are gradually improving since beginning. Aggravating factors: eating. Alleviating factors: none. Associated symptoms: fatigue. He denies belching, chills, dysuria, fever and headache. Appetite: normal. PO intake: decreased. Ambulatory without  assistance. Urinary symptoms: none. Sick contacts: none. Recent travel or camping: none. OTC treatment: none.  History reviewed. No pertinent surgical history.  ROS: As per HPI. All other systems negative.  OBJECTIVE:  Vitals:   03/02/19 1118  BP: (!) 152/98  Pulse: 99  Resp: 18  Temp: 97.6 F (36.4 C)  TempSrc: Temporal  SpO2: 100%    General appearance: alert; no distress Oropharynx: dry Lungs: clear to auscultation bilaterally; unlabored Heart: regular rate and rhythm Abdomen: soft; non-distended; no significant abdominal tenderness; reports "cramping" feeling; bowel sounds present; no masses or organomegaly; no guarding or rebound tenderness Back: no CVA tenderness Extremities: no edema; symmetrical with no gross deformities Skin: warm; dry Neurologic: normal gait Psychological: alert and cooperative; normal mood and affect  No Known Allergies                                             Past Medical History:  Diagnosis Date  . Anxiety   . Depression    Social History   Socioeconomic History  . Marital status: Married    Spouse name: Not on file  . Number of children: 1  . Years of education: Not on file  . Highest education level: Not on file  Occupational History  . Occupation: transportation  Social Needs  . Financial resource strain: Not on file  . Food insecurity:    Worry: Not on file    Inability: Not on file  . Transportation needs:    Medical: Not on file  Non-medical: Not on file  Tobacco Use  . Smoking status: Former Smoker    Start date: 07/30/2018  . Smokeless tobacco: Never Used  Substance and Sexual Activity  . Alcohol use: Yes  . Drug use: Not on file  . Sexual activity: Yes  Lifestyle  . Physical activity:    Days per week: Not on file    Minutes per session: Not on file  . Stress: Not on file  Relationships  . Social connections:    Talks on phone: Not on file    Gets together: Not on file    Attends religious service:  Not on file    Active member of club or organization: Not on file    Attends meetings of clubs or organizations: Not on file    Relationship status: Not on file  . Intimate partner violence:    Fear of current or ex partner: Not on file    Emotionally abused: Not on file    Physically abused: Not on file    Forced sexual activity: Not on file  Other Topics Concern  . Not on file  Social History Narrative  . Not on file   Family History  Problem Relation Age of Onset  . Gout Mother   . Hypertension Mother      Ashkan, Annunziato, MD 03/02/19 1155

## 2019-03-02 NOTE — ED Triage Notes (Signed)
Pt sts N/V/D x 4 days

## 2019-04-07 ENCOUNTER — Telehealth (INDEPENDENT_AMBULATORY_CARE_PROVIDER_SITE_OTHER): Payer: PRIVATE HEALTH INSURANCE | Admitting: Family Medicine

## 2019-04-07 ENCOUNTER — Other Ambulatory Visit: Payer: Self-pay

## 2019-04-07 DIAGNOSIS — M5442 Lumbago with sciatica, left side: Secondary | ICD-10-CM

## 2019-04-07 DIAGNOSIS — M4317 Spondylolisthesis, lumbosacral region: Secondary | ICD-10-CM

## 2019-04-07 MED ORDER — MELOXICAM 7.5 MG PO TABS: 7.5000 mg | ORAL_TABLET | Freq: Every day | ORAL | 0 refills | Status: DC

## 2019-04-07 MED ORDER — CYCLOBENZAPRINE HCL 5 MG PO TABS: 5.0000 mg | ORAL_TABLET | Freq: Three times a day (TID) | ORAL | 1 refills | Status: DC | PRN

## 2019-04-07 NOTE — Progress Notes (Signed)
CC- Sciatica- Patient had an injury a year ago and injured back. Still having trouble with stiffness and sciatic pain on left lower back and leg. The muscle seems to be getting tight at times.The muscle relaxer help a little but it do not relax the hip and leg. Would like to try to increase the flexeril to 10 mg and refill the meloxicam.

## 2019-04-07 NOTE — Patient Instructions (Addendum)
° ° ° °  If you have lab work done today you will be contacted with your lab results within the next 2 weeks.  If you have not heard from us then please contact us. The fastest way to get your results is to register for My Chart. ° ° °IF you received an x-ray today, you will receive an invoice from [facility]. Please contact [facility] at [phone] with questions or concerns regarding your invoice.  ° °IF you received labwork today, you will receive an invoice from [facility]. Please contact [facility] at [phone] with questions or concerns regarding your invoice.  ° °Our billing staff will not be able to assist you with questions regarding bills from these companies. ° °You will be contacted with the lab results as soon as they are available. The fastest way to get your results is to activate your My Chart account. Instructions are located on the last page of this paperwork. If you have not heard from us regarding the results in 2 weeks, please contact this office. °  ° ° ° °

## 2019-04-07 NOTE — Progress Notes (Signed)
Virtual Visit via Telephone Note  I connected with Arlys JohnBrian Scritchfield on 04/07/19 at 5:39 PM by telephone and verified that I am speaking with the correct Melody using two identifiers.   I discussed the limitations, risks, security and privacy concerns of performing an evaluation and management service by telephone and the availability of in [facility] appointments. I also discussed with the patient that there may be a patient responsible charge related to this service. The patient expressed understanding and agreed to proceed, consent obtained  Chief complaint:  Back pain, sciatica.  History of Present Illness:  Left-sided low back pain with left-sided sciatica Treated February 14. Did report fall few months prior.  Noted to have grade 1 spondylolisthesis but no other concerning bony findings.  Thought to have a lumbar strain as well at that time but strength was intact with good range of motion.  Initially tried meloxicam 7.5 mg daily, Flexeril at night, refer to physical therapy with plan to recheck in 3 to 4 weeks at February visit.  Did not go to physical therapy.  Unable to reach.  Back pain was doing better with mobic and flexeril. Felt better for about a month - pain had resolved, but same pain has returned over past 3 weeks.  No bowel or bladder incontinence, no saddle anesthesia, no lower extremity weakness.  With some stretches feels some numbness into left leg but resolves. Pain into left leg at times. No weakness, no weight loss, no fevers.     Patient Active Problem List   Diagnosis Date Noted  . OBESITY 11/05/2010  . ANXIETY 05/23/2010  . DEPRESSION 05/23/2010   Past Medical History:  Diagnosis Date  . Anxiety   . Depression    No past surgical history on file. No Known Allergies Prior to Admission medications   Medication Sig Start Date End Date Taking? Authorizing Provider  ibuprofen (ADVIL,MOTRIN) 200 MG tablet Take 200 mg by mouth every 6 (six) hours as needed.   Yes  [provider]  meloxicam (MOBIC) 7.5 MG tablet Take 1 tablet (7.5 mg total) by mouth daily. Patient not taking: Reported on 04/07/2019 01/30/19   Shade FloodGreene, Johnluke Haugen R, MD   Social History   Socioeconomic History  . Marital status: Married    Spouse name: Not on file  . Number of children: 1  . Years of education: Not on file  . Highest education level: Not on file  Occupational History  . Occupation: transportation  Social Needs  . Financial resource strain: Not on file  . Food insecurity:    Worry: Not on file    Inability: Not on file  . Transportation needs:    Medical: Not on file    Non-medical: Not on file  Tobacco Use  . Smoking status: Former Smoker    Start date: 07/30/2018  . Smokeless tobacco: Never Used  Substance and Sexual Activity  . Alcohol use: Yes  . Drug use: Not on file  . Sexual activity: Yes  Lifestyle  . Physical activity:    Days per week: Not on file    Minutes per session: Not on file  . Stress: Not on file  Relationships  . Social connections:    Talks on phone: Not on file    Gets together: Not on file    Attends religious service: Not on file    Active member of club or organization: Not on file    Attends meetings of clubs or organizations: Not on file  Relationship status: Not on file  . Intimate partner violence:    Fear of current or ex partner: Not on file    Emotionally abused: Not on file    Physically abused: Not on file    Forced sexual activity: Not on file  Other Topics Concern  . Not on file  Social History Narrative  . Not on file     Observations/Objective: No distress on phone. XR results from last visit:   EXAM: LUMBAR SPINE - COMPLETE 4+ VIEW  COMPARISON:  None.  FINDINGS: There 5 non rib-bearing lumbar type vertebrae. There is grade 1 anterolisthesis of L5 on S1 with moderate L5-S1 disc space narrowing. L5-S1 facet arthrosis is noted. No definite pars defects are identified. Vertebral body  heights are preserved without evidence of fracture.  IMPRESSION: 1. No evidence of acute osseous abnormality. 2. L5-S1 disc and facet degeneration with grade 1 anterolisthesis.   Electronically Signed   By: Sebastian Ache M.D.   On: 01/30/2019 15:30  Assessment and Plan: Left-sided low back pain with left-sided sciatica, unspecified chronicity - Plan: meloxicam (MOBIC) 7.5 MG tablet, cyclobenzaprine (FLEXERIL) 10 MG tablet   -Recurrent left low back pain with left-sided sciatica.  L5-S1 anterior listhesis with disc/facet degeneration.  Potentially could be related to previous fall.  Did have improvement with meloxicam and Flexeril, agreed to try that 1 more time but referred to back specialist to evaluate next step in treatment, specifically advanced imaging versus physical therapy versus injection.  RTC precautions if acute worsening.  Follow Up Instructions:    I discussed the assessment and treatment plan with the patient. The patient was provided an opportunity to ask questions and all were answered. The patient agreed with the plan and demonstrated an understanding of the instructions.   The patient was advised to call back or seek an in[facility] evaluation if the symptoms worsen or if the condition fails to improve as anticipated.  I provided 10 minutes of non-face-to-face time during this encounter.  Signed,   Meredith Staggers, MD [facility] at [facility].  04/07/19

## 2019-07-28 ENCOUNTER — Other Ambulatory Visit: Payer: Self-pay | Admitting: *Deleted

## 2019-07-28 DIAGNOSIS — Z20822 Contact with and (suspected) exposure to covid-19: Secondary | ICD-10-CM

## 2019-07-30 LAB — NOVEL CORONAVIRUS, NAA: SARS-CoV-2, NAA: NOT DETECTED

## 2021-10-10 ENCOUNTER — Ambulatory Visit (INDEPENDENT_AMBULATORY_CARE_PROVIDER_SITE_OTHER): Payer: Self-pay

## 2021-10-10 ENCOUNTER — Encounter (HOSPITAL_COMMUNITY): Payer: Self-pay | Admitting: Emergency Medicine

## 2021-10-10 ENCOUNTER — Other Ambulatory Visit: Payer: Self-pay

## 2021-10-10 ENCOUNTER — Ambulatory Visit (HOSPITAL_COMMUNITY)
Admission: EM | Admit: 2021-10-10 | Discharge: 2021-10-10 | Disposition: A | Discharge status: Home / Self Care | Payer: Self-pay | Attending: Emergency Medicine | Admitting: Emergency Medicine

## 2021-10-10 DIAGNOSIS — S161XXA Strain of muscle, fascia and tendon at neck level, initial encounter: Secondary | ICD-10-CM

## 2021-10-10 DIAGNOSIS — S66912A Strain of unspecified muscle, fascia and tendon at wrist and hand level, left hand, initial encounter: Secondary | ICD-10-CM

## 2021-10-10 DIAGNOSIS — M25532 Pain in left wrist: Secondary | ICD-10-CM

## 2021-10-10 DIAGNOSIS — M542 Cervicalgia: Secondary | ICD-10-CM

## 2021-10-10 DIAGNOSIS — M25531 Pain in right wrist: Secondary | ICD-10-CM

## 2021-10-10 DIAGNOSIS — S66911A Strain of unspecified muscle, fascia and tendon at wrist and hand level, right hand, initial encounter: Secondary | ICD-10-CM

## 2021-10-10 MED ORDER — IBUPROFEN 800 MG PO TABS: 800.0000 mg | ORAL_TABLET | Freq: Three times a day (TID) | ORAL | 0 refills | Status: AC

## 2021-10-10 MED ORDER — TIZANIDINE HCL 4 MG PO TABS: 4.0000 mg | ORAL_TABLET | Freq: Four times a day (QID) | ORAL | 0 refills | Status: AC | PRN
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
## 2021-10-10 NOTE — Discharge Instructions (Signed)
Take the ibuprofen 3 times a day as needed for pain.  You may also take Tylenol as needed. Take the tizanidine every 6 hours as needed for muscle pain and spasms.  Do not take this before driving as it can make you sleepy.  Drink plenty of fluids, especially water, and rest as much as possible over the next few days. You can use heat, ice, or alternate between heat and ice for comfort.  You can use IcyHot, lidocaine patches, Biofreeze, Aspercreme, or Voltaren gel as needed for pain relief.  Follow-up with orthopedics if symptoms do not improve in the next week.

## 2022-11-23 ENCOUNTER — Ambulatory Visit: Payer: Self-pay

## 2022-12-27 ENCOUNTER — Ambulatory Visit: Payer: PRIVATE HEALTH INSURANCE | Admitting: Family Medicine
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
